# Patient Record
Sex: Male | Born: 1995 | Race: White | Hispanic: No | Marital: Single | Smoking: Never smoker
Health system: Southern US, Community
[De-identification: ages and names within clinical notes are randomized; demographics above are authoritative.]

## PROBLEM LIST (undated history)

## (undated) DIAGNOSIS — J019 Acute sinusitis, unspecified: Secondary | ICD-10-CM

## (undated) DIAGNOSIS — N200 Calculus of kidney: Secondary | ICD-10-CM

## (undated) DIAGNOSIS — R42 Dizziness and giddiness: Secondary | ICD-10-CM

## (undated) DIAGNOSIS — F909 Attention-deficit hyperactivity disorder, unspecified type: Secondary | ICD-10-CM

## (undated) DIAGNOSIS — J309 Allergic rhinitis, unspecified: Secondary | ICD-10-CM

## (undated) DIAGNOSIS — L509 Urticaria, unspecified: Secondary | ICD-10-CM

## (undated) HISTORY — DX: Urticaria, unspecified: L50.9

## (undated) HISTORY — DX: Attention-deficit hyperactivity disorder, unspecified type: F90.9

## (undated) HISTORY — PX: MYRINGOTOMY: SHX2060

## (undated) HISTORY — DX: Calculus of kidney: N20.0

## (undated) HISTORY — DX: Acute sinusitis, unspecified: J01.90

## (undated) HISTORY — DX: Dizziness and giddiness: R42

## (undated) HISTORY — DX: Allergic rhinitis, unspecified: J30.9

## (undated) HISTORY — PX: TONSILLECTOMY AND ADENOIDECTOMY: SUR1326

---
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
## 2014-06-22 ENCOUNTER — Ambulatory Visit: Payer: Worker's Compensation

## 2014-06-25 ENCOUNTER — Other Ambulatory Visit: Payer: Worker's Compensation

## 2017-02-21 DIAGNOSIS — H5213 Myopia, bilateral: Secondary | ICD-10-CM | POA: Diagnosis not present

## 2017-08-03 ENCOUNTER — Encounter: Payer: Self-pay | Admitting: Emergency Medicine

## 2017-08-03 ENCOUNTER — Other Ambulatory Visit: Payer: Self-pay

## 2017-08-03 DIAGNOSIS — R1111 Vomiting without nausea: Secondary | ICD-10-CM | POA: Diagnosis not present

## 2017-08-03 DIAGNOSIS — F909 Attention-deficit hyperactivity disorder, unspecified type: Secondary | ICD-10-CM | POA: Diagnosis not present

## 2017-08-03 DIAGNOSIS — R112 Nausea with vomiting, unspecified: Secondary | ICD-10-CM | POA: Diagnosis not present

## 2017-08-03 DIAGNOSIS — N200 Calculus of kidney: Secondary | ICD-10-CM | POA: Insufficient documentation

## 2017-08-03 DIAGNOSIS — R109 Unspecified abdominal pain: Secondary | ICD-10-CM | POA: Diagnosis not present

## 2017-08-03 LAB — CBC
HEMATOCRIT: 44.1 % (ref 40.0–52.0)
HEMOGLOBIN: 14.7 g/dL (ref 13.0–18.0)
MCH: 28.7 pg (ref 26.0–34.0)
MCHC: 33.3 g/dL (ref 32.0–36.0)
MCV: 86.1 fL (ref 80.0–100.0)
Platelets: 264 10*3/uL (ref 150–440)
RBC: 5.12 MIL/uL (ref 4.40–5.90)
RDW: 12.1 % (ref 11.5–14.5)
WBC: 13.3 10*3/uL — AB (ref 3.8–10.6)

## 2017-08-03 NOTE — ED Triage Notes (Signed)
Pt presents to ED with worsening left sided flank pain "all day" and vomiting since around 2145 tonight. Pt denies difficulty urinating. No hx of the same.

## 2017-08-04 ENCOUNTER — Emergency Department
Admission: EM | Admit: 2017-08-04 | Discharge: 2017-08-04 | Disposition: A | Discharge status: Home / Self Care | Payer: 59 | Attending: Emergency Medicine | Admitting: Emergency Medicine

## 2017-08-04 ENCOUNTER — Emergency Department: Payer: 59

## 2017-08-04 DIAGNOSIS — R109 Unspecified abdominal pain: Secondary | ICD-10-CM

## 2017-08-04 DIAGNOSIS — N200 Calculus of kidney: Secondary | ICD-10-CM

## 2017-08-04 DIAGNOSIS — R1111 Vomiting without nausea: Secondary | ICD-10-CM | POA: Diagnosis not present

## 2017-08-04 DIAGNOSIS — F909 Attention-deficit hyperactivity disorder, unspecified type: Secondary | ICD-10-CM | POA: Diagnosis not present

## 2017-08-04 LAB — URINALYSIS, COMPLETE (UACMP) WITH MICROSCOPIC
Bilirubin Urine: NEGATIVE
GLUCOSE, UA: NEGATIVE mg/dL
KETONES UR: NEGATIVE mg/dL
Leukocytes, UA: NEGATIVE
NITRITE: NEGATIVE
PH: 5 (ref 5.0–8.0)
Protein, ur: 30 mg/dL — AB
SPECIFIC GRAVITY, URINE: 1.024 (ref 1.005–1.030)

## 2017-08-04 LAB — COMPREHENSIVE METABOLIC PANEL
ALT: 26 U/L (ref 17–63)
ANION GAP: 10 (ref 5–15)
AST: 48 U/L — ABNORMAL HIGH (ref 15–41)
Albumin: 4.2 g/dL (ref 3.5–5.0)
Alkaline Phosphatase: 159 U/L — ABNORMAL HIGH (ref 38–126)
BUN: 16 mg/dL (ref 6–20)
CHLORIDE: 104 mmol/L (ref 101–111)
CO2: 24 mmol/L (ref 22–32)
Calcium: 9.1 mg/dL (ref 8.9–10.3)
Creatinine, Ser: 1.19 mg/dL (ref 0.61–1.24)
Glucose, Bld: 159 mg/dL — ABNORMAL HIGH (ref 65–99)
POTASSIUM: 3.4 mmol/L — AB (ref 3.5–5.1)
Sodium: 138 mmol/L (ref 135–145)
TOTAL PROTEIN: 7.6 g/dL (ref 6.5–8.1)
Total Bilirubin: 0.6 mg/dL (ref 0.3–1.2)

## 2017-08-04 MED ORDER — ONDANSETRON HCL 4 MG/2ML IJ SOLN
4.0000 mg | Freq: Once | INTRAMUSCULAR | Status: AC
  Administered 2017-08-04: 4 mg via INTRAVENOUS
  Filled 2017-08-04: qty 2

## 2017-08-04 MED ORDER — OXYCODONE-ACETAMINOPHEN 5-325 MG PO TABS: 2.0000 | ORAL_TABLET | ORAL | 0 refills | Status: DC | PRN

## 2017-08-04 MED ORDER — ONDANSETRON 4 MG PO TBDP: 4.0000 mg | ORAL_TABLET | Freq: Three times a day (TID) | ORAL | 0 refills | Status: DC | PRN

## 2017-08-04 MED ORDER — KETOROLAC TROMETHAMINE 30 MG/ML IJ SOLN
30.0000 mg | Freq: Once | INTRAMUSCULAR | Status: AC
  Administered 2017-08-04: 30 mg via INTRAVENOUS
  Filled 2017-08-04: qty 1

## 2017-08-04 MED ORDER — SODIUM CHLORIDE 0.9 % IV BOLUS
1000.0000 mL | Freq: Once | INTRAVENOUS | Status: AC
  Administered 2017-08-04: 1000 mL via INTRAVENOUS

## 2017-08-04 MED ORDER — TAMSULOSIN HCL 0.4 MG PO CAPS: 0.4000 mg | ORAL_CAPSULE | Freq: Every day | ORAL | 0 refills | Status: DC

## 2017-08-04 MED ORDER — MORPHINE SULFATE (PF) 4 MG/ML IV SOLN
4.0000 mg | Freq: Once | INTRAVENOUS | Status: AC
  Administered 2017-08-04: 4 mg via INTRAVENOUS
  Filled 2017-08-04: qty 1

## 2017-08-04 NOTE — Discharge Instructions (Addendum)
Please follow-up with urology for further evaluation

## 2017-08-04 NOTE — ED Provider Notes (Signed)
[facility] Emergency Department Provider Note   ____________________________________________   First MD Initiated Contact with Patient 08/04/17 0107     (approximate)  I have reviewed the triage vital signs and the nursing notes.   HISTORY  Chief Complaint Flank Pain    HPI [facility] [employer] is a 22 y.o. male who comes into the hospital today with some left-sided flank pain.  The patient states that his pain started this afternoon.  It was initially an achy pain that started all at once but has been getting worse and worse throughout the evening.  The patient went to work with the pain and did not take anything for the pain.  Around 945 the patient vomited.  [facility] and then arrived back home and vomited some more.  The patient states that his pain is an 8 out of 10 in intensity currently.  He is never had anything like this before.  The patient states that the pain did start wrapping around his side.  He denies blood in his urine or pain with urination. He is here today for evaluation.   Past Medical History:  Diagnosis Date  . Acute sinusitis   . ADD   . Allergic rhinitis   . Dizziness   . Urticaria, unspecified     Patient Active Problem List   Diagnosis Date Noted  . Migraine 02/10/2013  . Dizziness   . Acute sinusitis   . Allergic rhinitis   . Urticaria, unspecified   . ADHD (attention deficit hyperactivity disorder)     Past Surgical History:  Procedure Laterality Date  . MYRINGOTOMY    . TONSILLECTOMY AND ADENOIDECTOMY      Prior to Admission medications   Not on File    Allergies Sulfa antibiotics  Family History  Problem Relation Age of Onset  . Other Mother        POTS  . Fibromyalgia Mother   . Healthy Father   . Hypertension Maternal Grandfather   . Stroke Paternal Grandmother     Social History Social History   Tobacco Use  . Smoking status: Never Smoker  . Smokeless tobacco:  Never Used  Substance Use Topics  . Alcohol use: No  . Drug use: No    Review of Systems  Constitutional: No fever/chills Eyes: No visual changes. ENT: No sore throat. Cardiovascular: Denies chest pain. Respiratory: Denies shortness of breath. Gastrointestinal: nausea and vomiting with No abdominal pain.  No diarrhea.  No constipation. Genitourinary: left flank pain Musculoskeletal: left flank pain Skin: Negative for rash. Neurological: Negative for headaches,    ____________________________________________   PHYSICAL EXAM:  VITAL SIGNS: ED Triage Vitals  Enc Vitals Group     BP 08/03/17 2330 138/77     Pulse Rate 08/03/17 2330 74     Resp 08/03/17 2330 20     Temp 08/03/17 2330 98.2 F (36.8 C)     Temp Source 08/03/17 2330 Oral     SpO2 08/03/17 2330 94 %     Weight 08/03/17 2331 245 lb (111.1 kg)     Height 08/03/17 2331 5\' 10"  (1.778 m)     Head Circumference --      Peak Flow --      Pain Score 08/03/17 2331 9     Pain Loc --      Pain Edu? --      Excl. in GC? --     Constitutional: Alert and  oriented. Well appearing and in moderate distress. Eyes: Conjunctivae are normal. PERRL. EOMI. Head: Atraumatic. Nose: No congestion/rhinnorhea. Mouth/Throat: Mucous membranes are moist.  Oropharynx non-erythematous. Cardiovascular: Normal rate, regular rhythm. Grossly normal heart sounds.  Good peripheral circulation. Respiratory: Normal respiratory effort.  No retractions. Lungs CTAB. Gastrointestinal: Soft and nontender. No distention. Positive bowel sounds, left CVA tenderness to palpation Musculoskeletal: No lower extremity tenderness nor edema.   Neurologic:  Normal speech and language.  Skin:  Skin is warm, dry and intact. Psychiatric: Mood and affect are normal.   ____________________________________________   LABS (all labs ordered are listed, but only abnormal results are displayed)  Labs Reviewed  COMPREHENSIVE METABOLIC PANEL - Abnormal; Notable  for the following components:      Result Value   Potassium 3.4 (*)    Glucose, Bld 159 (*)    AST 48 (*)    Alkaline Phosphatase 159 (*)    All other components within normal limits  CBC - Abnormal; Notable for the following components:   WBC 13.3 (*)    All other components within normal limits  URINALYSIS, COMPLETE (UACMP) WITH MICROSCOPIC - Abnormal; Notable for the following components:   Color, Urine YELLOW (*)    APPearance CLOUDY (*)    Hgb urine dipstick LARGE (*)    Protein, ur 30 (*)    Bacteria, UA RARE (*)    All other components within normal limits   ____________________________________________  EKG  none ____________________________________________  RADIOLOGY  ED MD interpretation:  CT renal stone study: Proximal left 2 mm ureteral stone causing mild to moderate left-sided hydroureteronephrosis seen just beyond the expected location of the UPJ  Official radiology report(s): Ct Renal Stone Study  Result Date: 08/04/2017 CLINICAL DATA:  Left flank pain all day with vomiting at 2145 hours EXAM: CT ABDOMEN AND PELVIS WITHOUT CONTRAST TECHNIQUE: Multidetector CT imaging of the abdomen and pelvis was performed following the standard protocol without IV contrast. COMPARISON:  None. FINDINGS: LOWER CHEST: Lung bases demonstrate bibasilar dependent atelectasis. Included heart size is normal. No pericardial effusion. HEPATOBILIARY: Liver and gallbladder are normal. PANCREAS: Normal. SPLEEN: Normal. ADRENALS/URINARY TRACT: Kidneys are orthotopic. Mild to moderate left-sided hydronephrosis and proximal hydroureter secondary to a 2 mm calculus just slightly beyond the left UPJ. Nonobstructing interpolar right renal calculi of similar size or less are also noted numbering at least 2 in total on the coronal images. The bladder is decompressed. Normal adrenal glands. STOMACH/BOWEL: The stomach, small and large bowel are normal in course and caliber without inflammatory changes. Normal  appendix. Sigmoid diverticulosis is noted without diverticulitis. VASCULAR/LYMPHATIC: Aortoiliac vessels are normal in course and caliber. No lymphadenopathy by CT size criteria. REPRODUCTIVE: Normal size prostate and seminal vesicles. OTHER: No intraperitoneal free fluid or free air. MUSCULOSKELETAL: No acute nor suspicious osseous abnormalities. Slight disc space narrowing L4-5 and moderate at L5-S1. IMPRESSION: 1. Proximal left 2 mm ureteral stone causing mild to moderate left-sided hydroureteronephrosis seen just beyond the expected location of the left ureteropelvic junction. 2. Nonobstructing interpolar right renal calculi measuring 2 mm or less. 3. Colonic diverticulosis without acute diverticulitis. Electronically Signed   By: Tollie Eth M.D.   On: 08/04/2017 02:19    ____________________________________________   PROCEDURES  Procedure(s) performed: None  Procedures  Critical Care performed: No  ____________________________________________   INITIAL IMPRESSION / ASSESSMENT AND PLAN / ED COURSE  As part of my medical decision making, I reviewed the following data within the electronic MEDICAL RECORD NUMBER Notes from prior ED  visits and [place] Controlled Substance Database   This is a 22 year old male who comes into the hospital today with some left-sided flank pain.  The patient has had this pain for the entire day but started vomiting which prompted him to come in for evaluation.  My differential diagnosis includes urinary tract infection, musculoskeletal pain, kidney stone.  We did check some blood work to include a CBC CMP and urinalysis.  The patient's white blood cell count is 13.3 and he does have too numerous to count red blood cells in his urine.  I will send the patient for a CT renal stone study.  He will also receive a dose of morphine Zofran and a liter of normal saline.  The patient will be reassessed.  The patient did receive a dose of Toradol for his pain as well.  His  pain is improved to a 2 out of 10.  I will have the patient finished his fluids and he will be discharged home to follow-up with urology.      ____________________________________________   FINAL CLINICAL IMPRESSION(S) / ED DIAGNOSES  Final diagnoses:  Kidney stone  Left flank pain     ED Discharge Orders    None       Note:  This document was prepared using Dragon voice recognition software and may include unintentional dictation errors.    Rebecka Apley, MD 08/04/17 [phone]

## 2017-08-04 NOTE — ED Notes (Signed)
Patient transported to CT at this time. 

## 2017-08-11 DIAGNOSIS — N202 Calculus of kidney with calculus of ureter: Secondary | ICD-10-CM | POA: Diagnosis not present

## 2017-09-09 ENCOUNTER — Ambulatory Visit: Payer: 59 | Admitting: Urology

## 2017-09-09 ENCOUNTER — Encounter: Payer: Self-pay | Admitting: Urology

## 2019-04-11 ENCOUNTER — Ambulatory Visit: Payer: HRSA Program | Attending: Internal Medicine

## 2019-04-11 DIAGNOSIS — Z20828 Contact with and (suspected) exposure to other viral communicable diseases: Secondary | ICD-10-CM | POA: Diagnosis not present

## 2019-04-11 DIAGNOSIS — Z20822 Contact with and (suspected) exposure to covid-19: Secondary | ICD-10-CM

## 2019-04-13 LAB — NOVEL CORONAVIRUS, NAA: SARS-CoV-2, NAA: NOT DETECTED

## 2019-09-26 IMAGING — CT CT RENAL STONE PROTOCOL
2 of 4 series · 16 of 46 positions shown, 18 images · non-contrast
Comparison: None.

CLINICAL DATA: Left flank pain all day with vomiting at 5965 hours

EXAM:
CT ABDOMEN AND PELVIS WITHOUT CONTRAST
TECHNIQUE: Multidetector CT imaging of the abdomen and pelvis was performed
following the standard protocol without IV contrast.

[Series 2: stone full standard · axial · 0.79mm/px · z∈[-420,+30]mm · 13 of 100 slices shown, 15 images]
[im 5/100  soft-tissue]
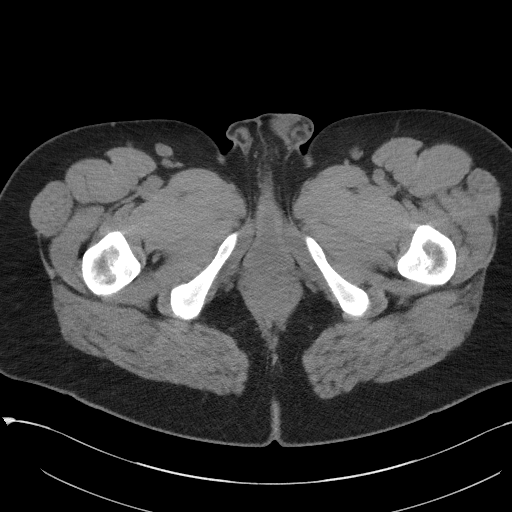
[im 5/100  bone]
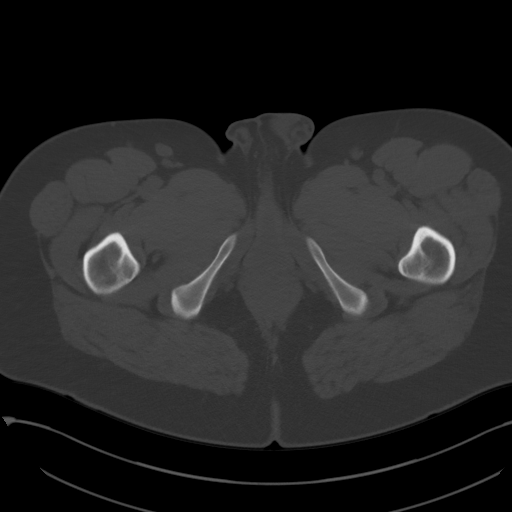
[im 13/100  soft-tissue]
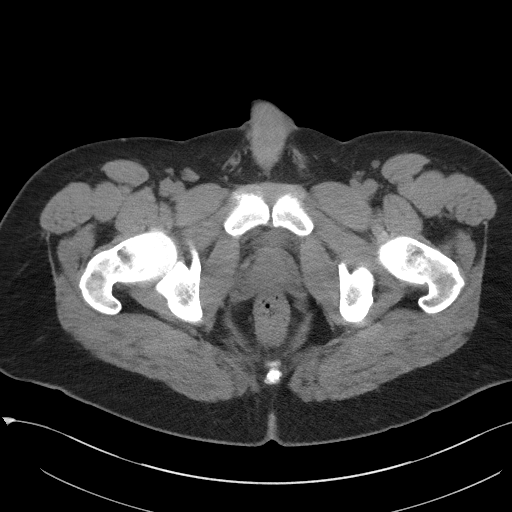
[im 21/100  soft-tissue]
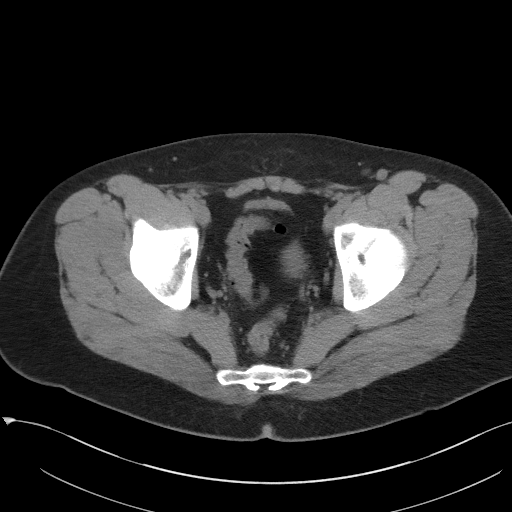
[im 29/100  soft-tissue]
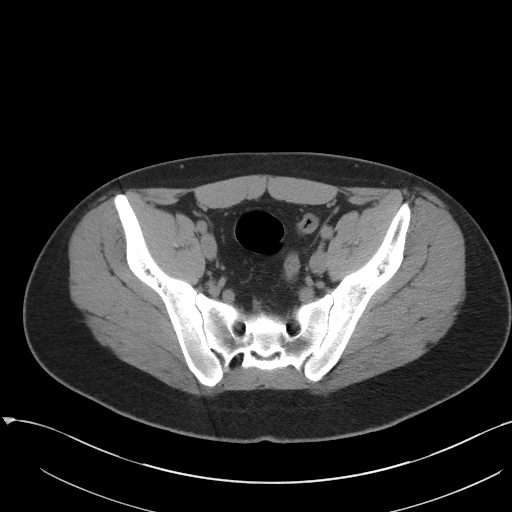
[im 34/100  soft-tissue]
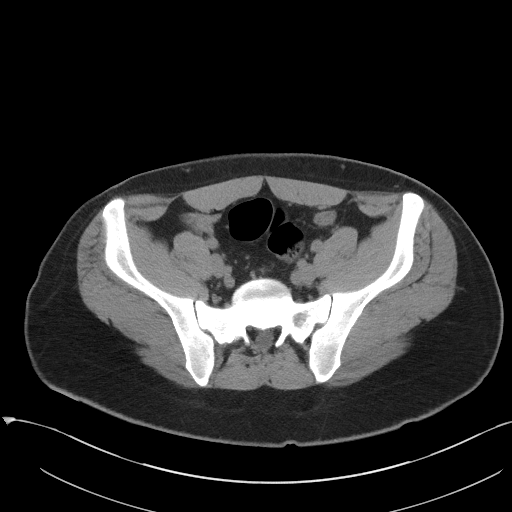
[im 42/100  soft-tissue]
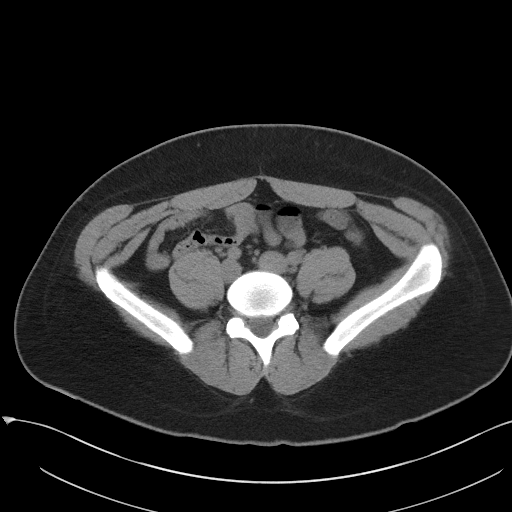
[im 50/100  soft-tissue]
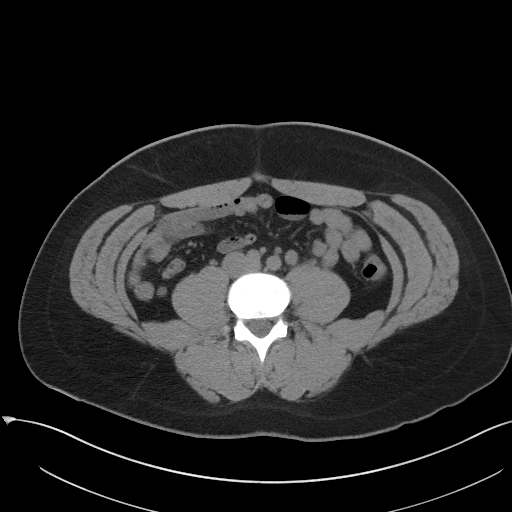
[im 58/100  soft-tissue]
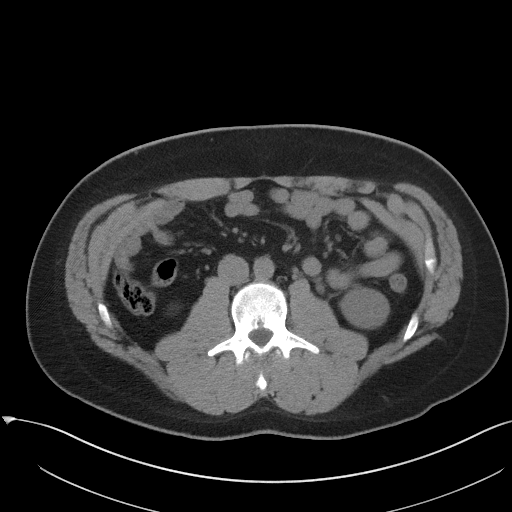
[im 67/100  soft-tissue]
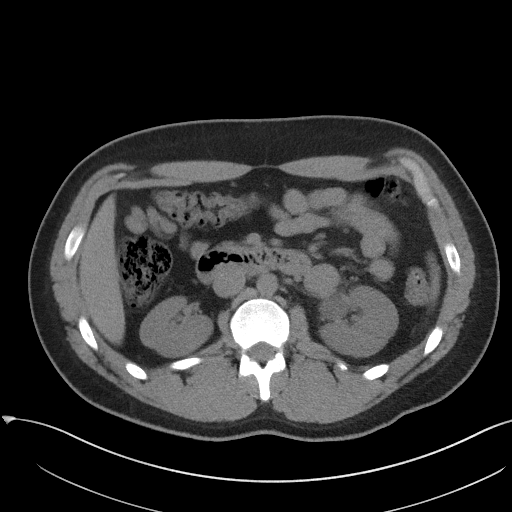
[im 67/100  bone]
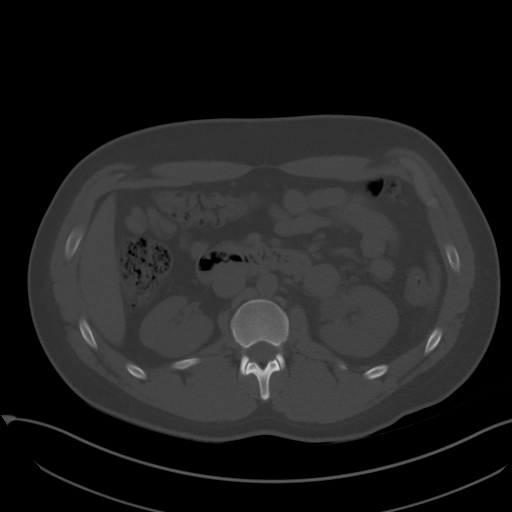
[im 71/100  soft-tissue]
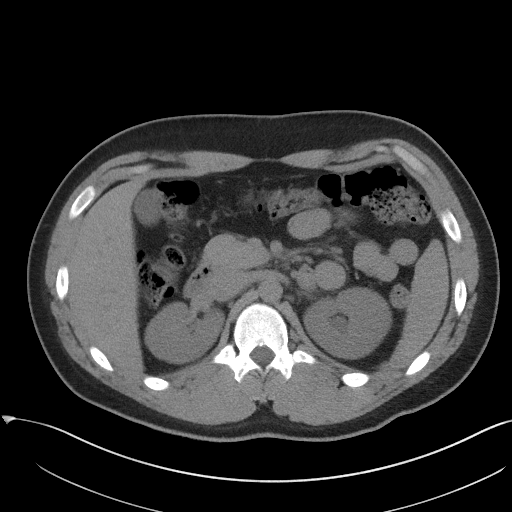
[im 79/100  soft-tissue]
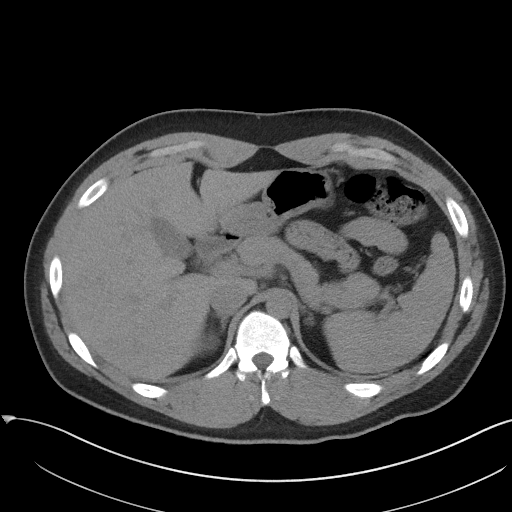
[im 87/100  soft-tissue]
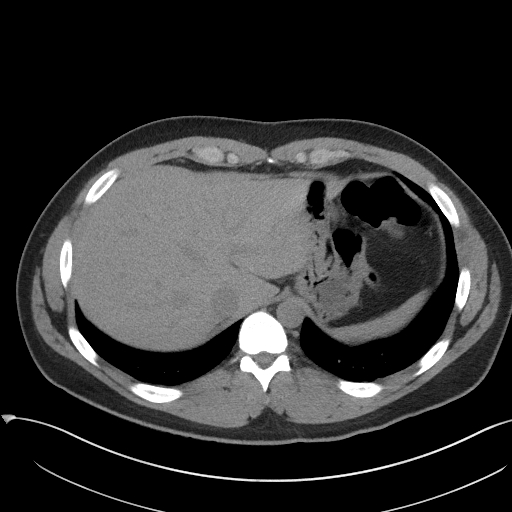
[im 95/100  soft-tissue]
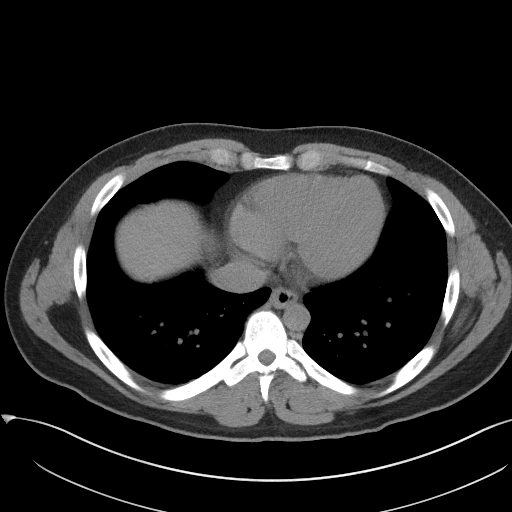

[Series 5: coronal · coronal · 0.77mm/px · 3 of 139 slices shown]
[im 47/139  soft-tissue]
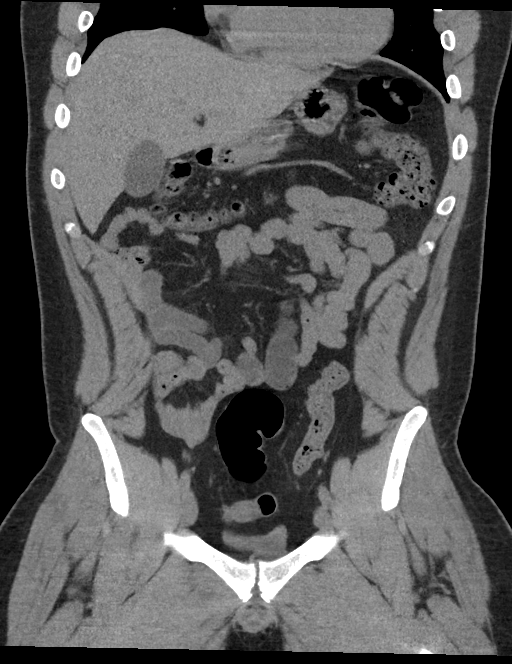
[im 62/139  soft-tissue]
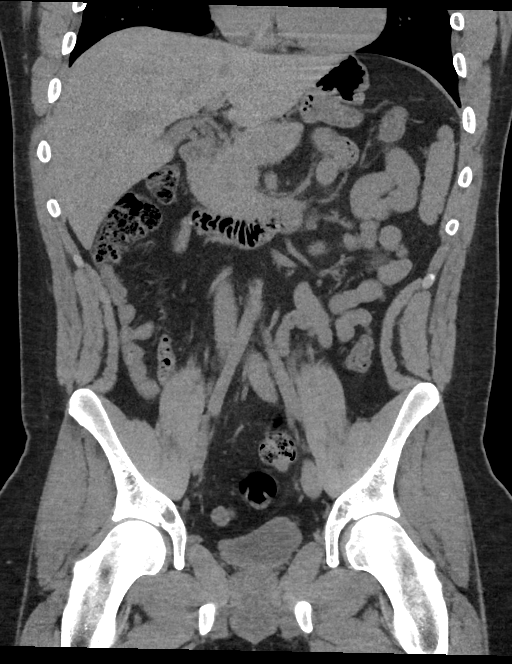
[im 77/139  soft-tissue]
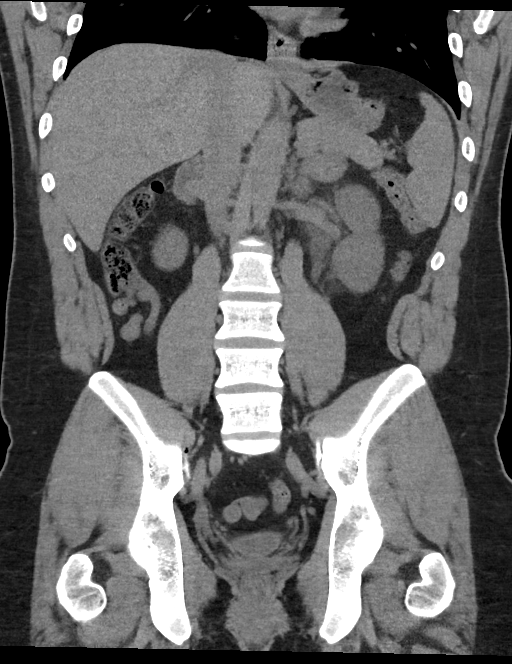

[16 of 46 positions shown; findings below may reference images not displayed]

FINDINGS: LOWER CHEST: Lung bases demonstrate bibasilar dependent atelectasis.
Included heart size is normal. No pericardial effusion.

HEPATOBILIARY: Liver and gallbladder are normal.

PANCREAS: Normal.

SPLEEN: Normal.

ADRENALS/URINARY TRACT: Kidneys are orthotopic. Mild to moderate
left-sided hydronephrosis and proximal hydroureter secondary to a 2
mm calculus just slightly beyond the left UPJ. Nonobstructing
interpolar right renal calculi of similar size or less are also
noted numbering at least 2 in total on the coronal images. The
bladder is decompressed. Normal adrenal glands.

STOMACH/BOWEL: The stomach, small and large bowel are normal in
course and caliber without inflammatory changes. Normal appendix.
Sigmoid diverticulosis is noted without diverticulitis.

VASCULAR/LYMPHATIC: Aortoiliac vessels are normal in course and
caliber. No lymphadenopathy by CT size criteria.

REPRODUCTIVE: Normal size prostate and seminal vesicles.

OTHER: No intraperitoneal free fluid or free air.

MUSCULOSKELETAL: No acute nor suspicious osseous abnormalities.
Slight disc space narrowing L4-5 and moderate at L5-S1.
IMPRESSION: 1. Proximal left 2 mm ureteral stone causing mild to moderate
left-sided hydroureteronephrosis seen just beyond the expected
location of the left ureteropelvic junction.
2. Nonobstructing interpolar right renal calculi measuring 2 mm or
less.
3. Colonic diverticulosis without acute diverticulitis.

## 2022-02-23 ENCOUNTER — Encounter (HOSPITAL_COMMUNITY): Payer: Self-pay | Admitting: Emergency Medicine

## 2022-02-23 ENCOUNTER — Ambulatory Visit (HOSPITAL_COMMUNITY)
Admission: EM | Admit: 2022-02-23 | Discharge: 2022-02-23 | Disposition: A | Discharge status: Home / Self Care | Payer: Commercial Managed Care - PPO | Attending: Physician Assistant | Admitting: Physician Assistant

## 2022-02-23 DIAGNOSIS — Z20828 Contact with and (suspected) exposure to other viral communicable diseases: Secondary | ICD-10-CM

## 2022-02-23 DIAGNOSIS — R051 Acute cough: Secondary | ICD-10-CM

## 2022-02-23 DIAGNOSIS — J069 Acute upper respiratory infection, unspecified: Secondary | ICD-10-CM

## 2022-02-23 MED ORDER — ALBUTEROL SULFATE HFA 108 (90 BASE) MCG/ACT IN AERS: 1.0000 | INHALATION_SPRAY | Freq: Four times a day (QID) | RESPIRATORY_TRACT | 0 refills | Status: DC | PRN

## 2022-02-23 MED ORDER — TRIAMCINOLONE ACETONIDE 40 MG/ML IJ SUSP
40.0000 mg | Freq: Once | INTRAMUSCULAR | Status: AC
  Administered 2022-02-23: 40 mg via INTRAMUSCULAR

## 2022-02-23 MED ORDER — HYDROCODONE BIT-HOMATROP MBR 5-1.5 MG/5ML PO SOLN: 5.0000 mL | Freq: Four times a day (QID) | ORAL | 0 refills | Status: DC | PRN

## 2022-02-23 MED ORDER — AMOXICILLIN 500 MG PO CAPS: 500.0000 mg | ORAL_CAPSULE | Freq: Three times a day (TID) | ORAL | 0 refills | Status: DC

## 2022-02-23 MED ORDER — TRIAMCINOLONE ACETONIDE 40 MG/ML IJ SUSP
INTRAMUSCULAR | Status: AC
  Filled 2022-02-23: qty 1

## 2022-02-23 NOTE — ED Triage Notes (Signed)
Pt reports a cough, fever and exposure to RSV. States his son was dx with RSV last week and pt patient started experiencing similar symptoms on Tuesday night. Has been taking Tylenol and a cough syrup.

## 2022-02-23 NOTE — Discharge Instructions (Addendum)
Advised take the Hycodan cough syrup, 1 to 2 teaspoons every 6-8 hours as needed for cough and chest congestion.  (Be careful with this medicine because it does cause drowsiness) Advised take the amoxicillin 500 mg every 8 hours on a regular basis to treat infection. Advised to use the albuterol inhaler, 2 puffs every 6 hours on a regular basis as this will help decrease cough and chest congestion. Advised to follow-up with PCP or return to urgent care if symptoms fail to improve.

## 2022-02-23 NOTE — ED Provider Notes (Signed)
[facility]    CSN: [id number] Arrival date & time: 02/23/22  1057      History   Chief Complaint Chief Complaint  Patient presents with   Cough    HPI Joshua Burch is a 26 y.o. male.   26 year old male presents with cough, congestion and fever.  Patient indicates that his 60-year-old son was diagnosed with RSV on Tuesday.  Patient indicates that his wife came down with RSV shortly after that.  Patient indicates for the past couple days he has been having cough which has been recurrent and progressive.  Patient indicates that he has having coughing spasms and fits to where he cannot seem to stop coughing.  He indicates he has been bringing up production which she is green and discolored.  Patient indicates he has been using an OTC cough preparations but this is not controlling his cough.  He also indicates having upper respiratory symptoms of sinus congestion, postnasal drip, rhinitis that is purulent, and he had congestion.  Patient indicates that he is been taking Tylenol for his fever.  Patient denies any wheezing or shortness of breath.  Patient indicates that he is tolerating fluids well but is just concerned with the amount and severity of his cough.   Cough Associated symptoms: rhinorrhea     Past Medical History:  Diagnosis Date   Acute sinusitis    ADD    Allergic rhinitis    Dizziness    Urticaria, unspecified     Patient Active Problem List   Diagnosis Date Noted   Migraine 02/10/2013   Dizziness    Acute sinusitis    Allergic rhinitis    Urticaria, unspecified    ADHD (attention deficit hyperactivity disorder)     Past Surgical History:  Procedure Laterality Date   MYRINGOTOMY     TONSILLECTOMY AND ADENOIDECTOMY         Home Medications    Prior to Admission medications   Medication Sig Start Date End Date Taking? Authorizing Provider  albuterol (VENTOLIN HFA) 108 (90 Base) MCG/ACT inhaler Inhale 1-2 puffs into the lungs every 6 (six)  hours as needed for wheezing or shortness of breath. 02/23/22  Yes Ellsworth Lennox, PA-C  amoxicillin (AMOXIL) 500 MG capsule Take 1 capsule (500 mg total) by mouth 3 (three) times daily. 02/23/22  Yes Ellsworth Lennox, PA-C  HYDROcodone bit-homatropine (HYCODAN) 5-1.5 MG/5ML syrup Take 5 mLs by mouth every 6 (six) hours as needed for cough. 02/23/22  Yes Ellsworth Lennox, PA-C  ondansetron (ZOFRAN ODT) 4 MG disintegrating tablet Take 1 tablet (4 mg total) by mouth every 8 (eight) hours as needed for nausea or vomiting. 08/04/17   Rebecka Apley, MD  oxyCODONE-acetaminophen (PERCOCET/ROXICET) 5-325 MG tablet Take 2 tablets by mouth every 4 (four) hours as needed for severe pain. 08/04/17   Rebecka Apley, MD  tamsulosin (FLOMAX) 0.4 MG CAPS capsule Take 1 capsule (0.4 mg total) by mouth daily. 08/04/17   Rebecka Apley, MD    Family History Family History  Problem Relation Age of Onset   Other Mother        POTS   Fibromyalgia Mother    Healthy Father    Hypertension Maternal Grandfather    Stroke Paternal Grandmother     Social History Social History   Tobacco Use   Smoking status: Never   Smokeless tobacco: Never  Vaping Use   Vaping Use: Never used  Substance Use Topics   Alcohol use: No  Drug use: No     Allergies   Sulfa antibiotics   Review of Systems Review of Systems  HENT:  Positive for postnasal drip, rhinorrhea and sinus pressure.   Respiratory:  Positive for cough.      Physical Exam Triage Vital Signs ED Triage Vitals  Enc Vitals Group     BP 02/23/22 1157 (!) 153/98     Pulse Rate 02/23/22 1157 93     Resp 02/23/22 1157 19     Temp 02/23/22 1157 99 F (37.2 C)     Temp Source 02/23/22 1157 Oral     SpO2 02/23/22 1157 95 %     Weight --      Height --      Head Circumference --      Peak Flow --      Pain Score 02/23/22 1156 0     Pain Loc --      Pain Edu? --      Excl. in GC? --    No data found.  Updated Vital Signs BP (!) 153/98 (BP  Location: Right Arm)   Pulse 93   Temp 99 F (37.2 C) (Oral)   Resp 19   SpO2 95%   Visual Acuity Right Eye Distance:   Left Eye Distance:   Bilateral Distance:    Right Eye Near:   Left Eye Near:    Bilateral Near:     Physical Exam Constitutional:      Appearance: Normal appearance.  HENT:     Right Ear: Ear canal normal. Tympanic membrane is injected.     Left Ear: Ear canal normal. Tympanic membrane is injected.     Mouth/Throat:     Mouth: Mucous membranes are moist.     Pharynx: Oropharynx is clear. No pharyngeal swelling.  Cardiovascular:     Rate and Rhythm: Normal rate and regular rhythm.     Heart sounds: Normal heart sounds.  Pulmonary:     Effort: Pulmonary effort is normal.     Breath sounds: Normal breath sounds and air entry. No wheezing, rhonchi or rales.  Lymphadenopathy:     Cervical: No cervical adenopathy.  Neurological:     Mental Status: He is alert.      UC Treatments / Results  Labs (all labs ordered are listed, but only abnormal results are displayed) Labs Reviewed - No data to display  EKG   Radiology No results found.  Procedures Procedures (including critical care time)  Medications Ordered in UC Medications  triamcinolone acetonide (KENALOG-40) injection 40 mg (has no administration in time range)    Initial Impression / Assessment and Plan / UC Course  I have reviewed the triage vital signs and the nursing notes.  Pertinent labs & imaging results that were available during my care of the patient were reviewed by me and considered in my medical decision making (see chart for details).    Plan: 1.  The upper respiratory infection will be treated with the following: A.  Hycodan cough syrup, 1 to 2 teaspoons every 6-8 hours as needed for cough and congestion. B.  Amoxil 500 mg every 8 hours to treat respiratory infection. 2.  The acute cough will be treated with the following: A.  Hycodan cough syrup, 1 to 2 teaspoons  every 6-8 hours to control cough and congestion. 3.  The exposure to RSV will be treated with the following: A.  Albuterol inhaler, 2 puffs every 6 hours on a regular basis  to help control cough and congestion. B.  The patient is given an injection of Aristocort 40 mg IM in the office today to help decrease congestion. 4.  Patient advised follow-up PCP or return to urgent care if symptoms fail to improve. Final Clinical Impressions(s) / UC Diagnoses   Final diagnoses:  Viral upper respiratory tract infection  Exposure to respiratory syncytial virus (RSV)  Acute cough     Discharge Instructions      Advised take the Hycodan cough syrup, 1 to 2 teaspoons every 6-8 hours as needed for cough and chest congestion.  (Be careful with this medicine because it does cause drowsiness) Advised take the amoxicillin 500 mg every 8 hours on a regular basis to treat infection. Advised to use the albuterol inhaler, 2 puffs every 6 hours on a regular basis as this will help decrease cough and chest congestion. Advised to follow-up with PCP or return to urgent care if symptoms fail to improve.    ED Prescriptions     Medication Sig Dispense Auth. Provider   HYDROcodone bit-homatropine (HYCODAN) 5-1.5 MG/5ML syrup Take 5 mLs by mouth every 6 (six) hours as needed for cough. 120 mL Ellsworth Lennox, PA-C   albuterol (VENTOLIN HFA) 108 (90 Base) MCG/ACT inhaler Inhale 1-2 puffs into the lungs every 6 (six) hours as needed for wheezing or shortness of breath. 6.7 g Ellsworth Lennox, PA-C   amoxicillin (AMOXIL) 500 MG capsule Take 1 capsule (500 mg total) by mouth 3 (three) times daily. 21 capsule Ellsworth Lennox, PA-C      I have reviewed the PDMP during this encounter.   Ellsworth Lennox, PA-C 02/23/22 1217

## 2023-08-24 ENCOUNTER — Encounter: Payer: Self-pay | Admitting: Family Medicine

## 2023-08-24 ENCOUNTER — Ambulatory Visit (INDEPENDENT_AMBULATORY_CARE_PROVIDER_SITE_OTHER): Admitting: Family Medicine

## 2023-08-24 VITALS — BP 109/75 | HR 83 | Temp 98.1°F | Ht 71.5 in | Wt 269.2 lb

## 2023-08-24 DIAGNOSIS — Z0000 Encounter for general adult medical examination without abnormal findings: Secondary | ICD-10-CM | POA: Diagnosis not present

## 2023-08-24 DIAGNOSIS — G4733 Obstructive sleep apnea (adult) (pediatric): Secondary | ICD-10-CM

## 2023-08-24 NOTE — Progress Notes (Signed)
 Office Note 08/24/2023  CC:  Chief Complaint  Patient presents with   Establish Care    Pt would like to discuss sleep apnea testing   HPI:  Joshua Burch is a 28 y.o.  male who is here to establish care, CPE, discuss snoring/sleepiness. Patient's most recent primary MD: none Old records in [facility] EMR were reviewed prior to or during today's visit.  Long history of snoring, progressively worsening. Wakes up with a gasp in the night sometimes.  Wife notes frequent apneic events and sleep. He has excessive daytime sleepiness.  Past Medical History:  Diagnosis Date   ADD    treated x 1 yr in HS, didn't like the way med made him feel   Nephrolithiasis    + hx ureterolithiasis    Past Surgical History:  Procedure Laterality Date   MYRINGOTOMY     TONSILLECTOMY AND ADENOIDECTOMY      Family History  Problem Relation Age of Onset   Other Mother        POTS   Fibromyalgia Mother    Miscarriages / [place] Mother    Other Father        Tumor   Hypertension Maternal Grandfather    Other Maternal Grandfather        Pre-diabetes   Stroke Paternal Grandmother     Social History   Socioeconomic History   Marital status: Single    Spouse name: Not on file   Number of children: Not on file   Years of education: Not on file   Highest education level: Not on file  Occupational History   Not on file  Tobacco Use   Smoking status: Never   Smokeless tobacco: Never  Vaping Use   Vaping status: Never Used  Substance and Sexual Activity   Alcohol use: No   Drug use: No   Sexual activity: Yes    Partners: Female    Birth control/protection: None    Comment: married  Other Topics Concern   Not on file  Social History Narrative   He is currently in college at [employer] studying Emergency Management   Lives with mom and dad         Social Drivers of Health   Financial Resource Strain: Not on file  Food Insecurity: Not on file  Transportation Needs: Not  on file  Physical Activity: Not on file  Stress: Not on file  Social Connections: Not on file  Intimate Partner Violence: Not on file    Outpatient Encounter Medications as of 08/24/2023  Medication Sig   [DISCONTINUED] albuterol  (VENTOLIN  HFA) 108 (90 Base) MCG/ACT inhaler Inhale 1-2 puffs into the lungs every 6 (six) hours as needed for wheezing or shortness of breath. (Patient not taking: Reported on 08/24/2023)   [DISCONTINUED] amoxicillin  (AMOXIL ) 500 MG capsule Take 1 capsule (500 mg total) by mouth 3 (three) times daily. (Patient not taking: Reported on 08/24/2023)   [DISCONTINUED] HYDROcodone  bit-homatropine (HYCODAN) 5-1.5 MG/5ML syrup Take 5 mLs by mouth every 6 (six) hours as needed for cough. (Patient not taking: Reported on 08/24/2023)   [DISCONTINUED] ondansetron  (ZOFRAN  ODT) 4 MG disintegrating tablet Take 1 tablet (4 mg total) by mouth every 8 (eight) hours as needed for nausea or vomiting. (Patient not taking: Reported on 08/24/2023)   [DISCONTINUED] oxyCODONE -acetaminophen  (PERCOCET/ROXICET) 5-325 MG tablet Take 2 tablets by mouth every 4 (four) hours as needed for severe pain. (Patient not taking: Reported on 08/24/2023)   [DISCONTINUED] tamsulosin  (FLOMAX ) 0.4 MG CAPS  capsule Take 1 capsule (0.4 mg total) by mouth daily. (Patient not taking: Reported on 08/24/2023)   No facility-administered encounter medications on file as of 08/24/2023.    Allergies  Allergen Reactions   Sulfa Antibiotics Nausea And Vomiting    Review of Systems  Constitutional:  Negative for appetite change, chills, fatigue and fever.  HENT:  Negative for congestion, dental problem, ear pain and sore throat.   Eyes:  Negative for discharge, redness and visual disturbance.  Respiratory:  Negative for cough, chest tightness, shortness of breath and wheezing.   Cardiovascular:  Negative for chest pain, palpitations and leg swelling.  Gastrointestinal:  Negative for abdominal pain, blood in stool, diarrhea,  nausea and vomiting.  Genitourinary:  Negative for difficulty urinating, dysuria, flank pain, frequency, hematuria and urgency.  Musculoskeletal:  Negative for arthralgias, back pain, joint swelling, myalgias and neck stiffness.  Skin:  Negative for pallor and rash.  Neurological:  Negative for dizziness, speech difficulty, weakness and headaches.  Hematological:  Negative for adenopathy. Does not bruise/bleed easily.  Psychiatric/Behavioral:  Negative for confusion and sleep disturbance. The patient is not nervous/anxious.     PE; Blood pressure 109/75, pulse 83, temperature 98.1 F (36.7 C), temperature source Oral, height 5' 11.5" (1.816 m), weight 269 lb 3.2 oz (122.1 kg), SpO2 96%. Body mass index is 37.02 kg/m.  Physical Exam  Gen: Alert, well appearing.  Patient is oriented to person, place, time, and situation. AFFECT: pleasant, lucid thought and speech. ENT: Ears: EACs clear, normal epithelium.  TMs with good light reflex and landmarks bilaterally.  Eyes: no injection, icteris, swelling, or exudate.  EOMI, PERRLA. Nose: no drainage or turbinate edema/swelling.  No injection or focal lesion.  Mouth: lips without lesion/swelling.  Oral mucosa pink and moist.  Dentition intact and without obvious caries or gingival swelling.  Oropharynx without erythema, exudate, or swelling.  Neck: supple/nontender.  No LAD, mass, or TM.  Carotid pulses 2+ bilaterally, without bruits. CV: RRR, no m/r/g.   LUNGS: CTA bilat, nonlabored resps, good aeration in all lung fields. ABD: soft, NT, ND, BS normal.  No hepatospenomegaly or mass.  No bruits. EXT: no clubbing, cyanosis, or edema.  Musculoskeletal: no joint swelling, erythema, warmth, or tenderness.  ROM of all joints intact. Skin - no sores or suspicious lesions or rashes or color changes  Pertinent labs:  Last CBC Lab Results  Component Value Date   WBC 13.3 (H) 08/03/2017   HGB 14.7 08/03/2017   HCT 44.1 08/03/2017   MCV 86.1  08/03/2017   MCH 28.7 08/03/2017   RDW 12.1 08/03/2017   PLT 264 08/03/2017   Last metabolic panel Lab Results  Component Value Date   GLUCOSE 159 (H) 08/03/2017   NA 138 08/03/2017   K 3.4 (L) 08/03/2017   CL 104 08/03/2017   CO2 24 08/03/2017   BUN 16 08/03/2017   CREATININE 1.19 08/03/2017   GFRNONAA >60 08/03/2017   CALCIUM 9.1 08/03/2017   PROT 7.6 08/03/2017   ALBUMIN 4.2 08/03/2017   BILITOT 0.6 08/03/2017   ALKPHOS 159 (H) 08/03/2017   AST 48 (H) 08/03/2017   ALT 26 08/03/2017   ANIONGAP 10 08/03/2017   Last thyroid functions Lab Results  Component Value Date   TSH 2.24 02/10/2013   ASSESSMENT AND PLAN:   New patient, establishing care  #1 health maintenance exam: Reviewed age and gender appropriate health maintenance issues (prudent diet, regular exercise, health risks of tobacco and excessive alcohol, use of seatbelts, fire  alarms in home, use of sunscreen).  Also reviewed age and gender appropriate health screening as well as vaccine recommendations. Vaccines: Tdap deferred. Labs: CBC with differential, c-Met, and lipid panel today.  He last ate about 4 hours ago.  #2 excessive daytime sleepiness. SPECT obstructive sleep apnea. Refer to sleep specialist with neurology.  An After Visit Summary was printed and given to the patient.  Return for as needed.  Signed:  Arletha Lady, MD           08/24/2023

## 2023-08-25 ENCOUNTER — Ambulatory Visit: Payer: Self-pay | Admitting: Family Medicine

## 2023-08-25 LAB — CBC WITH DIFFERENTIAL/PLATELET
Absolute Lymphocytes: 2668 {cells}/uL (ref 850–3900)
Absolute Monocytes: 653 {cells}/uL (ref 200–950)
Basophils Absolute: 28 {cells}/uL (ref 0–200)
Basophils Relative: 0.3 %
Eosinophils Absolute: 267 {cells}/uL (ref 15–500)
Eosinophils Relative: 2.9 %
HCT: 46.9 % (ref 38.5–50.0)
Hemoglobin: 16.1 g/dL (ref 13.2–17.1)
MCH: 29.8 pg (ref 27.0–33.0)
MCHC: 34.3 g/dL (ref 32.0–36.0)
MCV: 86.7 fL (ref 80.0–100.0)
MPV: 10.2 fL (ref 7.5–12.5)
Monocytes Relative: 7.1 %
Neutro Abs: 5584 {cells}/uL (ref 1500–7800)
Neutrophils Relative %: 60.7 %
Platelets: 298 10*3/uL (ref 140–400)
RBC: 5.41 10*6/uL (ref 4.20–5.80)
RDW: 11.9 % (ref 11.0–15.0)
Total Lymphocyte: 29 %
WBC: 9.2 10*3/uL (ref 3.8–10.8)

## 2023-08-25 LAB — COMPREHENSIVE METABOLIC PANEL WITH GFR
AG Ratio: 1.6 (calc) (ref 1.0–2.5)
ALT: 20 U/L (ref 9–46)
AST: 19 U/L (ref 10–40)
Albumin: 4.4 g/dL (ref 3.6–5.1)
Alkaline phosphatase (APISO): 140 U/L — ABNORMAL HIGH (ref 36–130)
BUN: 13 mg/dL (ref 7–25)
CO2: 24 mmol/L (ref 20–32)
Calcium: 9.4 mg/dL (ref 8.6–10.3)
Chloride: 103 mmol/L (ref 98–110)
Creat: 1 mg/dL (ref 0.60–1.24)
Globulin: 2.8 g/dL (ref 1.9–3.7)
Glucose, Bld: 88 mg/dL (ref 65–99)
Potassium: 4.1 mmol/L (ref 3.5–5.3)
Sodium: 140 mmol/L (ref 135–146)
Total Bilirubin: 0.4 mg/dL (ref 0.2–1.2)
Total Protein: 7.2 g/dL (ref 6.1–8.1)
eGFR: 105 mL/min/{1.73_m2} (ref >=60)

## 2023-08-25 LAB — LIPID PANEL
Cholesterol: 188 mg/dL (ref <200)
HDL: 39 mg/dL — ABNORMAL LOW (ref >=40)
LDL Cholesterol (Calc): 125 mg/dL — ABNORMAL HIGH
Non-HDL Cholesterol (Calc): 149 mg/dL — ABNORMAL HIGH (ref <130)
Total CHOL/HDL Ratio: 4.8 (calc) (ref <5.0)
Triglycerides: 127 mg/dL (ref <150)

## 2023-10-08 ENCOUNTER — Ambulatory Visit: Admitting: Neurology

## 2023-10-08 ENCOUNTER — Encounter: Payer: Self-pay | Admitting: Neurology

## 2023-10-08 VITALS — BP 134/88 | HR 75 | Ht 70.0 in | Wt 271.2 lb

## 2023-10-08 DIAGNOSIS — R635 Abnormal weight gain: Secondary | ICD-10-CM

## 2023-10-08 DIAGNOSIS — R0689 Other abnormalities of breathing: Secondary | ICD-10-CM

## 2023-10-08 DIAGNOSIS — R519 Headache, unspecified: Secondary | ICD-10-CM

## 2023-10-08 DIAGNOSIS — E669 Obesity, unspecified: Secondary | ICD-10-CM

## 2023-10-08 DIAGNOSIS — Z9189 Other specified personal risk factors, not elsewhere classified: Secondary | ICD-10-CM

## 2023-10-08 DIAGNOSIS — R0683 Snoring: Secondary | ICD-10-CM | POA: Diagnosis not present

## 2023-10-08 DIAGNOSIS — G4719 Other hypersomnia: Secondary | ICD-10-CM

## 2023-10-08 DIAGNOSIS — Z820 Family history of epilepsy and other diseases of the nervous system: Secondary | ICD-10-CM

## 2023-10-08 DIAGNOSIS — R0681 Apnea, not elsewhere classified: Secondary | ICD-10-CM

## 2023-10-08 NOTE — Progress Notes (Signed)
 Subjective:    Patient ID: Joshua Burch is a 28 y.o. male.  HPI    True Mar, MD, PhD [facility] [address] [address] [place], [place] [address]  Dear Dr. Candise,   I saw your patient, Joshua Burch, upon your kind request in my sleep clinic today for initial consultation of his sleep disorder, in particular, concern for underlying obstructive sleep apnea.  The patient is unaccompanied today.  As you know, Joshua Burch is a 28 year old male with an underlying medical history of ADD, palpitations, low back pain, nephrolithiasis, and obesity, who reports snoring and excessive daytime somnolence, as well as waking up with a sense of gasping for air and witnessed apneas per wife's observation.  His Epworth sleepiness score is 12 out of 24, fatigue severity score is 40 out of 63.  I reviewed your office note from 08/24/2023.  He has trouble falling asleep at times.  He has trouble waking up in the mornings and has to send at least 2 alarms.  Bedtime is generally between 10:30 PM and midnight and rise time between 540 5 and 6 AM.  He has no nightly nocturia but has had rare morning headaches, at times migraine-like.  His dad has sleep apnea and uses a PAP machine.  He lives with his family, including wife and 62-1/2-year-old son.  He works as a Soil scientist.  He has cut back on his caffeine intake and quit drinking alcohol in November 2024.  He currently drinks about 2 cups of coffee, 2-3 servings of tea or soda per day, energy drink about 2-3 times a week.  He has gained weight in the past 6 months in the realm of 10 pounds, particularly after stopping alcohol consumption altogether.  He does report a family history of alcoholism and he was drinking up to 6-8 beers per day or several servings of liquor per day.  He does not watch TV in his bedroom but does want something on his cell phone on YouTube typically.  They do not have any pets in the household.  He had a  tonsillectomy and adenoidectomy as a child.  He does not smoke cigarettes but does use nicotine pouches daily and used to dip daily.   His Past Medical History Is Significant For: Past Medical History:  Diagnosis Date   ADD    treated x 1 yr in HS, didn't like the way med made him feel   Nephrolithiasis    + hx ureterolithiasis    His Past Surgical History Is Significant For: Past Surgical History:  Procedure Laterality Date   MYRINGOTOMY     TONSILLECTOMY AND ADENOIDECTOMY      His Family History Is Significant For: Family History  Problem Relation Age of Onset   Other Mother        POTS   Fibromyalgia Mother    Miscarriages / [place] Mother    Other Father        Tumor   Sleep apnea Father    Hypertension Maternal Grandfather    Other Maternal Grandfather        Pre-diabetes   Stroke Paternal Grandmother     His Social History Is Significant For: Social History   Socioeconomic History   Marital status: Single    Spouse name: Not on file   Number of children: Not on file   Years of education: Not on file   Highest education level: Not on file  Occupational History  Not on file  Tobacco Use   Smoking status: Never   Smokeless tobacco: Never  Vaping Use   Vaping status: Never Used  Substance and Sexual Activity   Alcohol use: No   Drug use: No   Sexual activity: Yes    Partners: Female    Birth control/protection: None    Comment: married  Other Topics Concern   Not on file  Social History Narrative   He is currently in college at [employer] studying Emergency Management   Lives with mom and dad         Social Drivers of Health   Financial Resource Strain: Not on file  Food Insecurity: Not on file  Transportation Needs: Not on file  Physical Activity: Not on file  Stress: Not on file  Social Connections: Not on file    His Allergies Are:  Allergies  Allergen Reactions   Sulfa Antibiotics Nausea And Vomiting  :   His Current Medications  Are:  No outpatient encounter medications on file as of 10/08/2023.   No facility-administered encounter medications on file as of 10/08/2023.  :   Review of Systems:  Out of a complete 14 point review of systems, all are reviewed and negative with the exception of these symptoms as listed below:  Review of Systems  Neurological:        Pt here for sleep consult Pt snores,fatigue,headache. Pt denies sleep study,cpap machine ,hypertension    ESS:12 FSS ;40    Objective:  Neurological Exam  Physical Exam Physical Examination:   Vitals:   10/08/23 1505  BP: 134/88  Pulse: 75    General Examination: The patient is a very pleasant 28 y.o. male in no acute distress. He appears well-developed and well-nourished and well groomed.   HEENT: Normocephalic, atraumatic, pupils are equal, round and reactive to light, extraocular tracking is good without limitation to gaze excursion or nystagmus noted. Hearing is grossly intact. Face is symmetric with normal facial animation. Speech is clear with no dysarthria noted. There is no hypophonia. There is no lip, neck/head, jaw or voice tremor. Neck is supple with full range of passive and active motion. There are no carotid bruits on auscultation. Oropharynx exam reveals: No significant mouth dryness, good hygiene, moderate airway crowding secondary to small airway, Mallampati class II, tonsils absent.  Minimal overbite noted.  Tongue protrudes centrally and palate elevates symmetrically, neck circumference 17-1/2 inches.    Chest: Clear to auscultation without wheezing, rhonchi or crackles noted.  Heart: S1+S2+0, regular and normal without murmurs, rubs or gallops noted.   Abdomen: Soft, non-tender and non-distended.  Extremities: There is no pitting edema in the distal lower extremities bilaterally.   Skin: Warm and dry without trophic changes noted.   Musculoskeletal: exam reveals no obvious joint deformities.   Neurologically:  Mental  status: The patient is awake, alert and oriented in all 4 spheres. His immediate and remote memory, attention, language skills and fund of knowledge are appropriate. There is no evidence of aphasia, agnosia, apraxia or anomia. Speech is clear with normal prosody and enunciation. Thought process is linear. Mood is normal and affect is normal.  Cranial nerves II - XII are as described above under HEENT exam.  Motor exam: Normal bulk, strength and tone is noted. There is no obvious action or resting tremor.  Fine motor skills and coordination: grossly intact.  Cerebellar testing: No dysmetria or intention tremor. There is no truncal or gait ataxia.  Sensory exam: intact to light  touch in the upper and lower extremities.  Gait, station and balance: He stands easily. No veering to one side is noted. No leaning to one side is noted. Posture is age-appropriate and stance is narrow based. Gait shows normal stride length and normal pace. No problems turning are noted.   Assessment and Plan:  In summary, NEKODA CHOCK is a very pleasant 28 y.o.-year old male with an underlying medical history of ADD, palpitations, low back pain, nephrolithiasis, and obesity, whose history and physical exam are concerning for sleep disordered breathing, particularly obstructive sleep apnea (OSA). While a laboratory attended sleep study is typically considered gold standard for evaluation of sleep disordered breathing, we mutually agreed to proceed with a home sleep test at this time.   I had a long chat with the patient about my findings and the diagnosis of sleep apnea, particularly OSA, its prognosis and treatment options. We talked about medical/conservative treatments, surgical interventions and non-pharmacological approaches for symptom control. I explained, in particular, the risks and ramifications of untreated moderate to severe OSA, especially with respect to developing cardiovascular disease down the road, including  congestive heart failure (CHF), difficult to treat hypertension, cardiac arrhythmias (particularly A-fib), neurovascular complications including TIA, stroke and dementia. Even type 2 diabetes has, in part, been linked to untreated OSA. Symptoms of untreated OSA may include (but may not be limited to) daytime sleepiness, nocturia (i.e. frequent nighttime urination), memory problems, mood irritability and suboptimally controlled or worsening mood disorder such as depression and/or anxiety, lack of energy, lack of motivation, physical discomfort, as well as recurrent headaches, especially morning or nocturnal headaches. We talked about the importance of maintaining a healthy lifestyle and striving for healthy weight.  The importance of complete nicotine cessation was also addressed.  In addition, we talked about the importance of striving for and maintaining good sleep hygiene. I recommended a sleep study at this time. I outlined the differences between a laboratory attended sleep study which is considered more comprehensive and accurate over the option of a home sleep test (HST); the latter may lead to underestimation of sleep disordered breathing in some instances and does not help with diagnosing upper airway resistance syndrome and is not accurate enough to diagnose primary central sleep apnea typically. I outlined possible surgical and non-surgical treatment options of OSA, including the use of a positive airway pressure (PAP) device (i.e. CPAP, AutoPAP/APAP or BiPAP in certain circumstances), a custom-made dental device (aka oral appliance, which would require a referral to a specialist dentist or orthodontist typically, and is generally speaking not considered for patients with full dentures or edentulous state), upper airway surgical options, such as traditional UPPP (which is not considered a first-line treatment) or the Inspire device (hypoglossal nerve stimulator, which would involve a referral for  consultation with an ENT surgeon, after careful selection, following inclusion criteria - also not first-line treatment). I explained the PAP treatment option to the patient in detail, as this is generally considered first-line treatment.  The patient indicated that he would be willing to try PAP therapy, if the need arises. I explained the importance of being compliant with PAP treatment, not only for insurance purposes but primarily to improve patient's symptoms symptoms, and for the patient's long term health benefit, including to reduce His cardiovascular risks longer-term.    We will pick up our discussion about the next steps and treatment options after testing.  We will keep him posted as to the test results by phone call and/or MyChart  messaging where possible.  We will plan to follow-up in sleep clinic accordingly as well.  I answered all his questions today and the patient was in agreement.   I encouraged him to call with any interim questions, concerns, problems or updates or email us  through MyChart.  Generally speaking, sleep test authorizations may take up to 2 weeks, sometimes less, sometimes longer, the patient is encouraged to get in touch with us  if they do not hear back from the sleep lab staff directly within the next 2 weeks.  Thank you very much for allowing me to participate in the care of this nice patient. If I can be of any further assistance to you please do not hesitate to call me at [phone].  Sincerely,   True Mar, MD, PhD

## 2023-10-08 NOTE — Patient Instructions (Signed)

## 2023-10-14 ENCOUNTER — Telehealth: Payer: Self-pay | Admitting: Neurology

## 2023-10-14 NOTE — Telephone Encounter (Signed)
 HST [employer] pending

## 2023-11-02 NOTE — Telephone Encounter (Signed)
 HST [employer] auth: [id number] (exp. 10/30/23 to 01/30/24)

## 2023-11-27 ENCOUNTER — Ambulatory Visit (INDEPENDENT_AMBULATORY_CARE_PROVIDER_SITE_OTHER): Admitting: Neurology

## 2023-11-27 DIAGNOSIS — R635 Abnormal weight gain: Secondary | ICD-10-CM

## 2023-11-27 DIAGNOSIS — R519 Headache, unspecified: Secondary | ICD-10-CM

## 2023-11-27 DIAGNOSIS — G4733 Obstructive sleep apnea (adult) (pediatric): Secondary | ICD-10-CM | POA: Diagnosis not present

## 2023-11-27 DIAGNOSIS — E669 Obesity, unspecified: Secondary | ICD-10-CM

## 2023-11-27 DIAGNOSIS — Z820 Family history of epilepsy and other diseases of the nervous system: Secondary | ICD-10-CM

## 2023-11-27 DIAGNOSIS — Z9189 Other specified personal risk factors, not elsewhere classified: Secondary | ICD-10-CM

## 2023-11-27 DIAGNOSIS — R0689 Other abnormalities of breathing: Secondary | ICD-10-CM

## 2023-11-27 DIAGNOSIS — R0681 Apnea, not elsewhere classified: Secondary | ICD-10-CM

## 2023-11-27 DIAGNOSIS — G4719 Other hypersomnia: Secondary | ICD-10-CM

## 2023-11-27 DIAGNOSIS — R0683 Snoring: Secondary | ICD-10-CM

## 2023-12-10 NOTE — Progress Notes (Unsigned)
   [facility]  HOME SLEEP TEST (SANSA) REPORT (Mail-Out Device):   STUDY DATE: ***  DOB: Dec 01, 1995  MRN: [id number]  ORDERING CLINICIAN: True Mar, MD, PhD   REFERRING CLINICIAN: McGowen, Aleene DEL, MD ***  CLINICAL INFORMATION/HISTORY (obtained from visit note dated 10/08/2023): 28 year old male with an underlying medical history of ADD, palpitations, low back pain, nephrolithiasis, and obesity, who reports snoring and excessive daytime somnolence, as well as waking up with a sense of gasping for air and witnessed apneas.   PATIENT'S LAST REPORTED EPWORTH SLEEPINESS SCORE (ESS): 12/24.  BMI (at the time of sleep clinic visit and/or test date): 88.9 kg/m  FINDINGS:   Study Protocol:    The SANSA single-point-of-skin-contact chest-worn sensor - an FDA cleared and DOT approved type 4 home sleep test device - measures eight physiological channels,  including blood oxygen saturation (measured via PPG [photoplethysmography]), EKG-derived heart rate, respiratory effort, chest movement (measured via accelerometer), snoring, body position, and actigraphy. The device is designed to be worn for up to 10 hours per study.   Sleep Summary:   Total Recording Time (hours, min): *** hours, *** min  Total Effective Sleep Time (hours, min):  *** hours, *** min  Sleep Efficiency (%):    ***%   Respiratory Indices:   Calculated sAHI (per hour):  ***/hour         Oxygen Saturation Statistics:    Oxygen Saturation (%) Mean: ***%   Minimum oxygen saturation (%):                 ***%   O2 Saturation Range (%): *** - ***%   Time below or at 88% saturation: *** min   Pulse Rate Statistics:   Pulse Mean (bpm):    ***/min    Pulse Range (*** - ***/min)   Snoring: ***   IMPRESSION/DIAGNOSES:   OSA (obstructive sleep apnea), ***  ***Nocturnal Hypoxemia  RECOMMENDATIONS:   ***   INTERPRETING PHYSICIAN:   True Mar, MD, PhD Medical Director, [facility]) Diplomat, ABPN (Neurology and Sleep)   [facility] [address], [address] [place], [place] [address] [phone]

## 2023-12-18 ENCOUNTER — Ambulatory Visit: Payer: Self-pay | Admitting: Neurology

## 2023-12-18 DIAGNOSIS — G4733 Obstructive sleep apnea (adult) (pediatric): Secondary | ICD-10-CM

## 2023-12-18 NOTE — Procedures (Signed)
 [facility]  HOME SLEEP TEST (SANSA) REPORT (Mail-Out Device):   STUDY DATE: 12/03/2023  DOB: 05-23-1995  MRN: [id number]  ORDERING CLINICIAN: True Mar, MD, PhD   REFERRING CLINICIAN: McGowen, Aleene DEL, MD   CLINICAL INFORMATION/HISTORY (obtained from visit note dated 10/08/2023): 28 year old male with an underlying medical history of ADD, palpitations, low back pain, nephrolithiasis, and obesity, who reports snoring and excessive daytime somnolence, as well as waking up with a sense of gasping for air and witnessed apneas.   PATIENT'S LAST REPORTED EPWORTH SLEEPINESS SCORE (ESS): 12/24.  BMI (at the time of sleep clinic visit and/or test date): 38.9 kg/m  FINDINGS:   Study Protocol:    The SANSA single-point-of-skin-contact chest-worn sensor - an FDA cleared and DOT approved type 4 home sleep test device - measures eight physiological channels,  including blood oxygen saturation (measured via PPG [photoplethysmography]), EKG-derived heart rate, respiratory effort, chest movement (measured via accelerometer), snoring, body position, and actigraphy. The device is designed to be worn for up to 10 hours per study.   Sleep Summary:   Total Recording Time (hours, min): 8 hours, 38 min  Total Effective Sleep Time (hours, min):  6 hours, 42 min  Sleep Efficiency (%):    86%   Respiratory Indices:   Calculated sAHI (per hour):  21.9/hour         Oxygen Saturation Statistics:    Oxygen Saturation (%) Mean: 93.7%   Minimum oxygen saturation (%):                 79%   O2 Saturation Range (%): 79-99.4%   Time below or at 88% saturation: 2 min   Pulse Rate Statistics:   Pulse Mean (bpm):    61/min    Pulse Range (43-104/min)   Snoring: Mild to moderate  IMPRESSION/DIAGNOSES:   OSA (obstructive sleep apnea), moderate   RECOMMENDATIONS:   This home sleep test demonstrates moderate obstructive sleep apnea with a total AHI of 21.9/hour and O2  nadir of 79%.  Mild to moderate snoring was detected. Treatment with a positive airway pressure (PAP) device is recommended. The patient will be advised to proceed with an autoPAP titration/trial at home for now. A full night titration study may be considered to optimize treatment settings, monitor proper oxygen saturations and aid with improvement of tolerance and adherence, if needed down the road.  Alternative treatment options may include a dental device through dentistry or orthodontics in selected patients or Inspire (hypoglossal nerve stimulator) in carefully selected patients (meeting inclusion criteria).  Concomitant weight loss is recommended (where clinically appropriate). Please note that untreated obstructive sleep apnea may carry additional perioperative morbidity. Patients with significant obstructive sleep apnea should receive perioperative PAP therapy and the surgeons and particularly the anesthesiologist should be informed of the diagnosis and the severity of the sleep disordered breathing. The patient should be cautioned not to drive, work at heights, or operate dangerous or heavy equipment when tired or sleepy. Review and reiteration of good sleep hygiene measures should be pursued with any patient. Other causes of the patient's symptoms, including circadian rhythm disturbances, an underlying mood disorder, medication effect and/or an underlying medical problem cannot be ruled out based on this test. Clinical correlation is recommended.  The patient and his referring provider will be notified of the test results. The patient will be seen in follow up in sleep clinic at [facility].  I certify that I have reviewed the raw data recording prior to the issuance of  this report in accordance with the standards of the American Academy of Sleep Medicine (AASM).    INTERPRETING PHYSICIAN:   True Mar, MD, PhD Medical Director, [facility]) Diplomat, ABPN  (Neurology and Sleep)   [facility] [address] [place], [place] [address] [phone]

## 2024-01-26 ENCOUNTER — Telehealth: Payer: Self-pay

## 2024-01-26 NOTE — Telephone Encounter (Signed)
 Lvm 1st attempt by hf R/s initial cpap apt w/np  Sending to phnoe room to get set up with initial cpap appt r/s to an np instead of dr. Buck.

## 2024-03-02 ENCOUNTER — Encounter: Admitting: Neurology

## 2024-03-15 NOTE — Telephone Encounter (Addendum)
 Pt initial CPAP FU is scheduled tomorrow. Pt set up date 01/14/24. Please push appt out closer to  End of Dec for compliance as she is not compliant at this moment. Thanks

## 2024-03-16 ENCOUNTER — Encounter: Admitting: Adult Health
